# Patient Record
Sex: Male | Born: 2000 | Race: Black or African American | Hispanic: No | Marital: Single | Smoking: Current every day smoker
Health system: Southern US, Community
[De-identification: ages and names within clinical notes are randomized; demographics above are authoritative.]

---
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
## 2021-06-18 ENCOUNTER — Emergency Department (HOSPITAL_COMMUNITY)
Admission: EM | Admit: 2021-06-18 | Discharge: 2021-06-18 | Disposition: A | Discharge status: Home / Self Care | Payer: Self-pay | Attending: Emergency Medicine | Admitting: Emergency Medicine

## 2021-06-18 ENCOUNTER — Other Ambulatory Visit: Payer: Self-pay

## 2021-06-18 ENCOUNTER — Encounter (HOSPITAL_COMMUNITY): Payer: Self-pay

## 2021-06-18 DIAGNOSIS — Z202 Contact with and (suspected) exposure to infections with a predominantly sexual mode of transmission: Secondary | ICD-10-CM | POA: Insufficient documentation

## 2021-06-18 LAB — URINALYSIS, ROUTINE W REFLEX MICROSCOPIC
Bilirubin Urine: NEGATIVE
Glucose, UA: NEGATIVE mg/dL
Hgb urine dipstick: NEGATIVE
Ketones, ur: 5 mg/dL — AB
Nitrite: NEGATIVE
Protein, ur: 30 mg/dL — AB
Specific Gravity, Urine: 1.03 (ref 1.005–1.030)
WBC, UA: >50 WBC/hpf — ABNORMAL HIGH (ref 0–5)
pH: 7 (ref 5.0–8.0)

## 2021-06-18 MED ORDER — LIDOCAINE HCL (PF) 1 % IJ SOLN
1.0000 mL | Freq: Once | INTRAMUSCULAR | Status: DC
  Filled 2021-06-18: qty 5

## 2021-06-18 MED ORDER — DOXYCYCLINE HYCLATE 100 MG PO TABS: 100.0000 mg | ORAL_TABLET | Freq: Two times a day (BID) | ORAL | 0 refills | Status: AC

## 2021-06-18 MED ORDER — CEFTRIAXONE SODIUM 500 MG IJ SOLR
500.0000 mg | Freq: Once | INTRAMUSCULAR | Status: AC
  Administered 2021-06-18: 500 mg via INTRAMUSCULAR
  Filled 2021-06-18: qty 500

## 2021-06-18 MED ORDER — METRONIDAZOLE 500 MG PO TABS
2000.0000 mg | ORAL_TABLET | Freq: Once | ORAL | Status: AC
  Administered 2021-06-18: 2000 mg via ORAL
  Filled 2021-06-18: qty 4

## 2021-06-18 NOTE — ED Triage Notes (Signed)
Pt to ED from home with reports of exposure to STD, reports abd pain and penile discharge that started 2 days ago.  ?

## 2021-06-18 NOTE — ED Provider Notes (Signed)
?[facility] EMERGENCY DEPARTMENT ?Provider Note ? ? ?CSN: [id number] ?Arrival date & time: 06/18/21  1908 ? ?  ? ?History ? ?Chief Complaint  ?Patient presents with  ? Exposure to STD  ? ? ?Barry Cox is a 21 y.o. male presenting with a possible STI exposure 4 days ago.  Patient woke up the next morning with urethral discharge, subjective fever, suprapubic tenderness.  Highest recorded fever at home was 97.5 degrees.  Discharge described as creamy/white-colored.  Denies scrotal tenderness, painful bowel movements or abnormal bowel movements, decreased urination, difficulty urinating, hematuria, headache, nausea or vomiting.  Denies consuming alcohol in the last 72 hours.  Denies recent illness, shortness of breath, chest pain, dizziness, lightheadedness. ? ?The history is provided by the patient and medical records.  ?Exposure to STD ? ?  ? ?Home Medications ?Prior to Admission medications   ?Medication Sig Start Date End Date Taking? Authorizing Provider  ?doxycycline (VIBRA-TABS) 100 MG tablet Take 1 tablet (100 mg total) by mouth 2 (two) times daily for 7 days. 06/18/21 06/25/21 Yes Cecil Cobbs, PA-C  ?diphenhydrAMINE (BENADRYL) 25 mg capsule Take 25 mg by mouth once as needed. For allergies     [provider]  ?diphenhydrAMINE (SOMINEX) 25 MG tablet Take 25 mg by mouth at bedtime as needed.      [provider]  ?hydrocortisone 1 % cream Apply 1 application topically 2 (two) times daily. As needed for itching     [provider]  ?   ? ?Allergies    ?Patient has no known allergies.   ? ?Review of Systems   ?Review of Systems  ?Constitutional:  Positive for fever (Subjective).  ?Genitourinary:  Positive for dysuria and penile discharge.  ? ?Physical Exam ?Updated Vital Signs ?BP 130/62 (BP Location: Right Arm)   Pulse 76   Temp 97.9 ?F (36.6 ?C) (Oral)   Resp 18   Ht 6' (1.829 m)   Wt 83.9 kg   SpO2 99%   BMI 25.09 kg/m?  ?Physical Exam ?Vitals and nursing note reviewed.  Exam conducted with a chaperone present.  ?Constitutional:   ?   General: He is not in acute distress. ?   Appearance: He is well-developed.  ?HENT:  ?   Head: Normocephalic and atraumatic.  ?Eyes:  ?   Conjunctiva/sclera: Conjunctivae normal.  ?Cardiovascular:  ?   Rate and Rhythm: Normal rate and regular rhythm.  ?Pulmonary:  ?   Effort: Pulmonary effort is normal. No respiratory distress.  ?Abdominal:  ?   General: Bowel sounds are normal. There is no distension.  ?   Palpations: Abdomen is soft.  ?   Tenderness: There is abdominal tenderness (Mild/Discomfort) in the suprapubic area. There is no guarding or rebound.  ?Genitourinary: ?   Penis: Discharge present. No erythema, tenderness, swelling or lesions.   ?   Testes: Normal.     ?   Right: Tenderness or swelling not present.     ?   Left: Tenderness or swelling not present.  ?   Epididymis:  ?   Right: Normal.  ?   Left: Normal.  ?Musculoskeletal:     ?   General: No swelling.  ?   Cervical back: Neck supple.  ?Skin: ?   General: Skin is warm and dry.  ?   Capillary Refill: Capillary refill takes less than 2 seconds.  ?Neurological:  ?   Mental Status: He is alert and oriented to person, place, and time.  ?Psychiatric:     ?  Mood and Affect: Mood normal.  ? ? ?ED Results / Procedures / Treatments   ?Labs ?(all labs ordered are listed, but only abnormal results are displayed) ?Labs Reviewed  ?URINALYSIS, ROUTINE W REFLEX MICROSCOPIC - Abnormal; Notable for the following components:  ?    Result Value  ? APPearance HAZY (*)   ? Ketones, ur 5 (*)   ? Protein, ur 30 (*)   ? Leukocytes,Ua MODERATE (*)   ? WBC, UA >50 (*)   ? Bacteria, UA RARE (*)   ? All other components within normal limits  ?RPR  ?HIV ANTIBODY (ROUTINE TESTING W REFLEX)  ?GC/CHLAMYDIA PROBE AMP ([facility]) NOT AT [facility]  ? ? ?EKG ?None ? ?Radiology ?No results found. ? ?Procedures ?Procedures  ? ? ?Medications Ordered in ED ?Medications  ?lidocaine (PF) (XYLOCAINE) 1 % injection 1 mL (has no  administration in time range)  ?cefTRIAXone (ROCEPHIN) injection 500 mg (500 mg Intramuscular Given 06/18/21 2158)  ?metroNIDAZOLE (FLAGYL) tablet 2,000 mg (2,000 mg Oral Given 06/18/21 2158)  ? ? ?ED Course/ Medical Decision Making/ A&P ?  ?                        ?Medical Decision Making ?Amount and/or Complexity of Data Reviewed ?External Data Reviewed: notes. ?Labs: ordered. Decision-making details documented in ED Course. ? ?Risk ?OTC drugs. ?Prescription drug management. ? ? ?Patient presenting for STD exposure with urethral discharge, dysuria, and suprapubic tenderness.  Denies painful or difficult bowel movements, or fever to indicate prostatitis.  No tenderness to palpation or edema or erythema of the testes or epididymis to suggest orchitis or epididymitis.  STD cultures obtained including HIV, syphilis, trichomonas, gonorrhea and chlamydia -- results pending. ? ?Patient to be discharged with instructions to follow up with PCP. Discussed importance of using protection when sexually active. Pt understands that they have GC/Chlamydia cultures pending and that they will need to inform all sexual partners if results return positive.   Denies alcohol consumption in the last 72 hours.  Patient has been treated prophylactically with Flagyl and Rocephin.   Patient will take doxycycline in outpatient setting. ? ?I discussed the patient and their case with my attending, Dr. Effie Shy, who agreed with the proposed treatment course.  After consideration of the diagnostic results and the patients response to treatment, I feel that the patent would benefit from outpatient antibiotic treatment and follow up with primary care.  Discussed course of treatment thoroughly with the patient and he demonstrated understanding.  Patient in agreement and has no further questions. ? ?This chart was dictated using voice recognition software.  Despite best efforts to proofread,  errors can occur which can change the documentation  meaning. ? ? ? ? ? ? ? ? ?Final Clinical Impression(s) / ED Diagnoses ?Final diagnoses:  ?STD exposure  ? ? ?Rx / DC Orders ?ED Discharge Orders   ? ?      Ordered  ?  doxycycline (VIBRA-TABS) 100 MG tablet  2 times daily       ? 06/18/21 2136  ? ?  ?  ? ?  ? ? ?  ?Cecil Cobbs, PA-C ?06/18/21 2343 ? ?  ?Mancel Bale, MD ?06/20/21 1746 ? ?

## 2021-06-18 NOTE — Discharge Instructions (Addendum)
Follow-up with your primary care in the next 3 to 5 days for reevaluation and continued medical management ? ?A prescription for an antibiotic by the name of doxycycline has been sent to your pharmacy.  Please take this as prescribed until course is completed.  Please take with food. ? ?You must refrain from consuming alcohol for the next 2 to 3 days as discussed, as one of the medications provided for you today puts you at an increased risk for severe vomiting and nausea if mixed with alcohol. ? ?Return to the ED for new or worsening symptoms as discussed ?

## 2021-06-18 NOTE — ED Notes (Signed)
Pt NAD, a/ox4, states began having penile discharge, dysuria and lower abd pain 4 days ago s/p sexual encounter ?

## 2021-06-18 NOTE — ED Notes (Signed)
Pt NAD, a/ox4. Pt verbalizes understanding of all DC and f/u instructions. All questions answered. Pt walks with steady gait to lobby at DC.  ? ?

## 2021-06-19 LAB — GC/CHLAMYDIA PROBE AMP (~~LOC~~) NOT AT ARMC
Chlamydia: NEGATIVE
Comment: NEGATIVE
Comment: NORMAL
Neisseria Gonorrhea: POSITIVE — AB

## 2021-06-19 LAB — RPR: RPR Ser Ql: NONREACTIVE

## 2021-06-19 LAB — HIV ANTIBODY (ROUTINE TESTING W REFLEX): HIV Screen 4th Generation wRfx: NONREACTIVE

## 2021-08-28 ENCOUNTER — Encounter (HOSPITAL_COMMUNITY): Payer: Self-pay

## 2021-08-28 ENCOUNTER — Emergency Department (HOSPITAL_COMMUNITY): Payer: Worker's Compensation

## 2021-08-28 ENCOUNTER — Other Ambulatory Visit: Payer: Self-pay

## 2021-08-28 ENCOUNTER — Emergency Department (HOSPITAL_COMMUNITY)
Admission: EM | Admit: 2021-08-28 | Discharge: 2021-08-28 | Disposition: A | Discharge status: Home / Self Care | Payer: Worker's Compensation | Attending: Emergency Medicine | Admitting: Emergency Medicine

## 2021-08-28 DIAGNOSIS — S61315A Laceration without foreign body of left ring finger with damage to nail, initial encounter: Secondary | ICD-10-CM | POA: Diagnosis not present

## 2021-08-28 DIAGNOSIS — W230XXA Caught, crushed, jammed, or pinched between moving objects, initial encounter: Secondary | ICD-10-CM | POA: Insufficient documentation

## 2021-08-28 DIAGNOSIS — S6710XA Crushing injury of unspecified finger(s), initial encounter: Secondary | ICD-10-CM

## 2021-08-28 DIAGNOSIS — Y990 Civilian activity done for income or pay: Secondary | ICD-10-CM | POA: Diagnosis not present

## 2021-08-28 DIAGNOSIS — S62633B Displaced fracture of distal phalanx of left middle finger, initial encounter for open fracture: Secondary | ICD-10-CM | POA: Diagnosis not present

## 2021-08-28 DIAGNOSIS — S61319A Laceration without foreign body of unspecified finger with damage to nail, initial encounter: Secondary | ICD-10-CM

## 2021-08-28 DIAGNOSIS — Z23 Encounter for immunization: Secondary | ICD-10-CM | POA: Insufficient documentation

## 2021-08-28 DIAGNOSIS — S62639B Displaced fracture of distal phalanx of unspecified finger, initial encounter for open fracture: Secondary | ICD-10-CM

## 2021-08-28 DIAGNOSIS — S6992XA Unspecified injury of left wrist, hand and finger(s), initial encounter: Secondary | ICD-10-CM | POA: Diagnosis present

## 2021-08-28 MED ORDER — LIDOCAINE HCL (PF) 1 % IJ SOLN
30.0000 mL | Freq: Once | INTRAMUSCULAR | Status: AC
  Administered 2021-08-28: 30 mL
  Filled 2021-08-28: qty 30

## 2021-08-28 MED ORDER — CEPHALEXIN 500 MG PO CAPS: 500.0000 mg | ORAL_CAPSULE | Freq: Two times a day (BID) | ORAL | 0 refills | Status: AC

## 2021-08-28 MED ORDER — OXYCODONE-ACETAMINOPHEN 5-325 MG PO TABS
1.0000 | ORAL_TABLET | Freq: Once | ORAL | Status: AC
  Administered 2021-08-28: 1 via ORAL
  Filled 2021-08-28: qty 1

## 2021-08-28 MED ORDER — OXYCODONE-ACETAMINOPHEN 5-325 MG PO TABS: 1.0000 | ORAL_TABLET | Freq: Four times a day (QID) | ORAL | 0 refills | Status: AC | PRN

## 2021-08-28 MED ORDER — TETANUS-DIPHTH-ACELL PERTUSSIS 5-2.5-18.5 LF-MCG/0.5 IM SUSY
0.5000 mL | PREFILLED_SYRINGE | Freq: Once | INTRAMUSCULAR | Status: AC
  Administered 2021-08-28: 0.5 mL via INTRAMUSCULAR
  Filled 2021-08-28: qty 0.5

## 2021-08-28 NOTE — Discharge Instructions (Signed)
As we discussed, the tip of your middle and ring finger of your left hand are broken. You also have lacerations of the nailbed of both of these fingers which have been repaired in the emergency department this evening. You will need to follow-up with hand surgery for further evaluation and management of this. I have given you a referral with a number to call to schedule an appointment with our hand surgeon Dr. Caralyn Guile. In the interim, please keep these wounds covered with the dressings I have provided for you and splinted. You will also need to fill and complete the course of antibiotics that I have prescribed for you. I have also given you a prescription for narcotics to that as prescribed as needed for severe pain only. Do not drive or operate heavy machinery while taking this medication.   You may wash your hands with soap and water and pat dry gently. I also recommend that you do regular dressing changes.   Return if you develop signs of infection including fevers, chills, redness, or yellow discharge. You will also need to return if the metal pieces in your nailbed become displaced as this could cause your nail to not heal correctly.  Return if development of any new or worsening symptoms.

## 2021-08-28 NOTE — ED Triage Notes (Signed)
Pt arrived POV from [facility] c/o a left hand/finger fx. Pt states he jammed his fingers at work.

## 2021-08-28 NOTE — ED Provider Notes (Signed)
[facility] EMERGENCY DEPARTMENT Provider Note   CSN: [id number] Arrival date & time: 08/28/21  1310     History {Add pertinent medical, surgical, social history, OB history to HPI:1} Chief Complaint  Patient presents with   Finger Injury    Barry Cox is a 21 y.o. male.  Patient with no pertinent past medical history presents today with finger injury. He states that earlier today at work he had the tip of his left third and fourth digits crushed between 2 steel doors.  He states that he went to urgent care for same and was sent here for further evaluation and management of his injuries.  The history is provided by the patient. No language interpreter was used.       Home Medications Prior to Admission medications   Medication Sig Start Date End Date Taking? Authorizing Provider  diphenhydrAMINE (BENADRYL) 25 mg capsule Take 25 mg by mouth once as needed. For allergies     [provider]  diphenhydrAMINE (SOMINEX) 25 MG tablet Take 25 mg by mouth at bedtime as needed.      [provider]  hydrocortisone 1 % cream Apply 1 application topically 2 (two) times daily. As needed for itching     [provider]      Allergies    Patient has no known allergies.    Review of Systems   Review of Systems  Musculoskeletal:  Positive for arthralgias.  All other systems reviewed and are negative.  Physical Exam Updated Vital Signs BP (!) 142/79 (BP Location: Right Arm)   Pulse 82   Temp 98.4 F (36.9 C) (Oral)   Resp 20   Ht 5\' 11"  (1.803 m)   Wt 81.6 kg   SpO2 98%   BMI 25.10 kg/m  Physical Exam Vitals and nursing note reviewed.  Constitutional:      General: He is not in acute distress.    Appearance: Normal appearance. He is normal weight. He is not ill-appearing, toxic-appearing or diaphoretic.  HENT:     Head: Normocephalic and atraumatic.  Cardiovascular:     Rate and Rhythm: Normal rate.  Pulmonary:     Effort:  Pulmonary effort is normal. No respiratory distress.  Musculoskeletal:        General: Normal range of motion.     Cervical back: Normal range of motion.     Comments: Left third and fourth fingers with bilateral nailbed lacerations and subungal hematomas. See images below for further  Skin:    General: Skin is warm and dry.  Neurological:     General: No focal deficit present.     Mental Status: He is alert.  Psychiatric:        Mood and Affect: Mood normal.        Behavior: Behavior normal.    ED Results / Procedures / Treatments   Labs (all labs ordered are listed, but only abnormal results are displayed) Labs Reviewed - No data to display  EKG None  Radiology DG Hand Complete Left  Result Date: 08/28/2021 CLINICAL DATA:  Hand injury. EXAM: LEFT HAND - COMPLETE 3+ VIEW COMPARISON:  Same-day radiograph at 1041 hours FINDINGS: Soft tissue swelling about the third and fourth digits. Comminuted fractures of the tuft of the third and fourth digit. IMPRESSION: Comminuted tuft fractures of the third and fourth digit. Electronically Signed   By: 08/30/2021 M.D.   On: 08/28/2021 14:42    Procedures Procedures  {Document cardiac monitor,  telemetry assessment procedure when appropriate:1}  Medications Ordered in ED Medications  oxyCODONE-acetaminophen (PERCOCET/ROXICET) 5-325 MG per tablet 1 tablet (1 tablet Oral Given 08/28/21 1538)  lidocaine (PF) (XYLOCAINE) 1 % injection 30 mL (30 mLs Infiltration Given 08/28/21 1538)  Tdap (BOOSTRIX) injection 0.5 mL (0.5 mLs Intramuscular Given 08/28/21 1538)    ED Course/ Medical Decision Making/ A&P                           Medical Decision Making Amount and/or Complexity of Data Reviewed Radiology: ordered.  Risk Prescription drug management.     {Document critical care time when appropriate:1} {Document review of labs and clinical decision tools ie heart score, Chads2Vasc2 etc:1}  {Document your independent review of  radiology images, and any outside records:1} {Document your discussion with family members, caretakers, and with consultants:1} {Document social determinants of health affecting pt's care:1} {Document your decision making why or why not admission, treatments were needed:1} Final Clinical Impression(s) / ED Diagnoses Final diagnoses:  None    Rx / DC Orders ED Discharge Orders     None

## 2023-05-07 IMAGING — CR DG HAND COMPLETE 3+V*L*
3 series · 3 of 3 positions shown · non-contrast
Comparison: Same-day radiograph at 1581 hours

CLINICAL DATA: Hand injury.

EXAM:
LEFT HAND - COMPLETE 3+ VIEW

[hand pa]
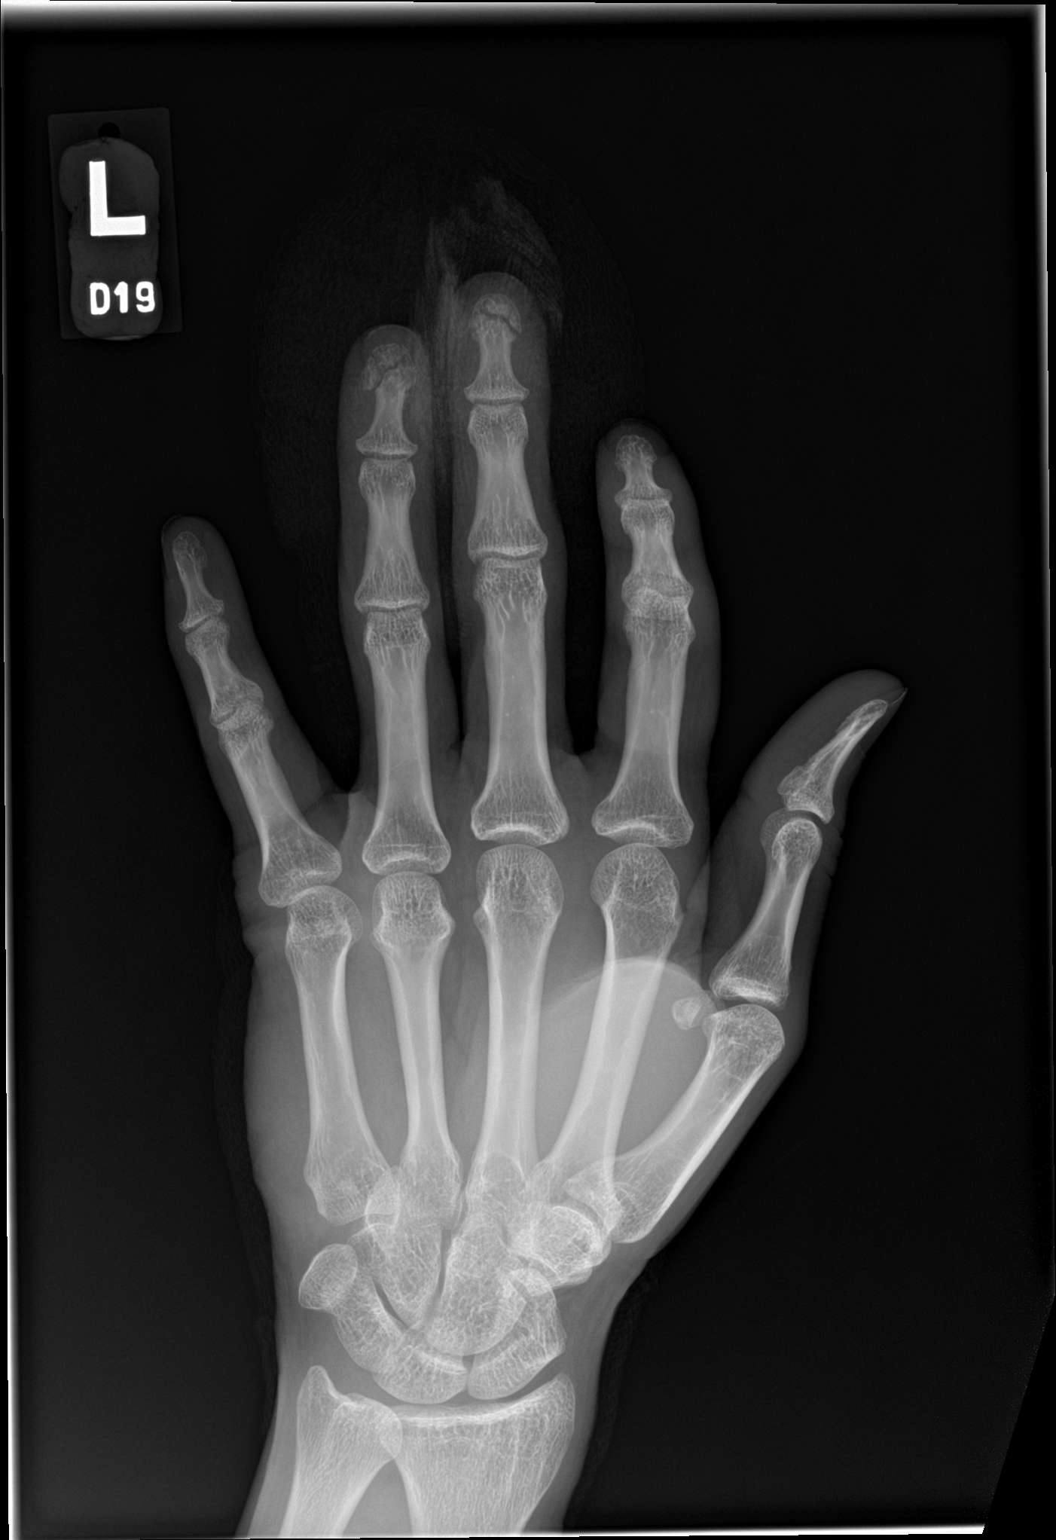

[hand obl]
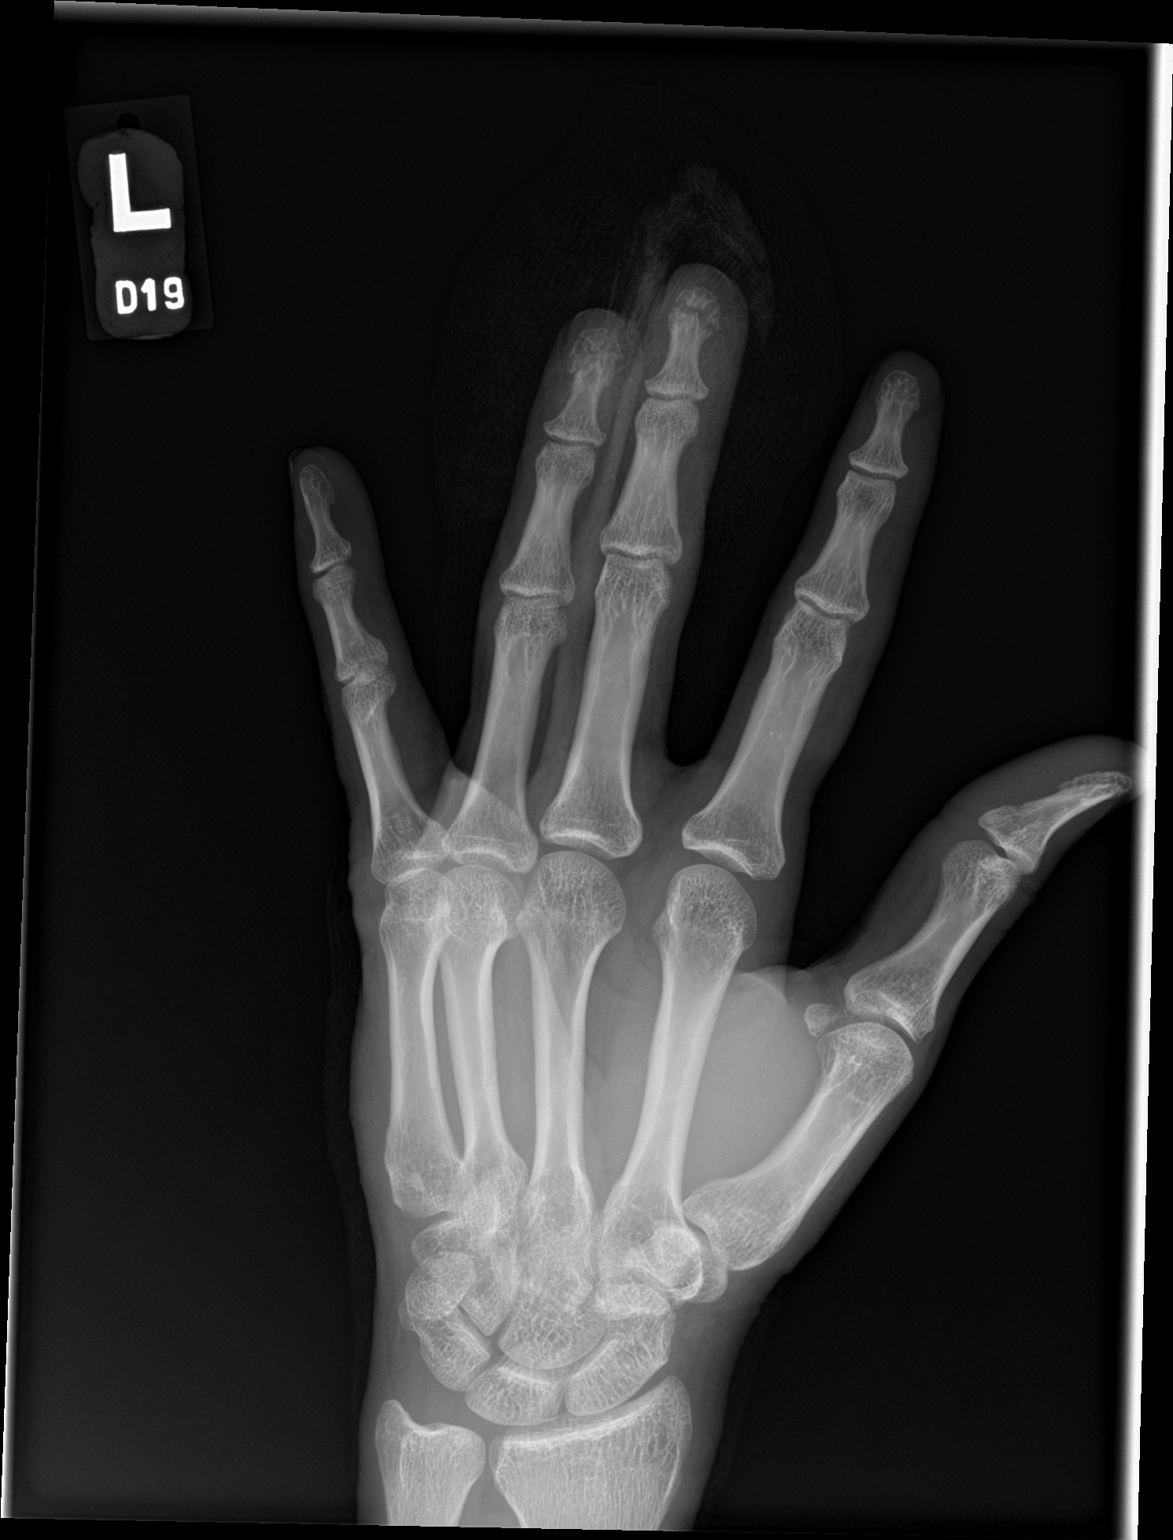

[hand lat]
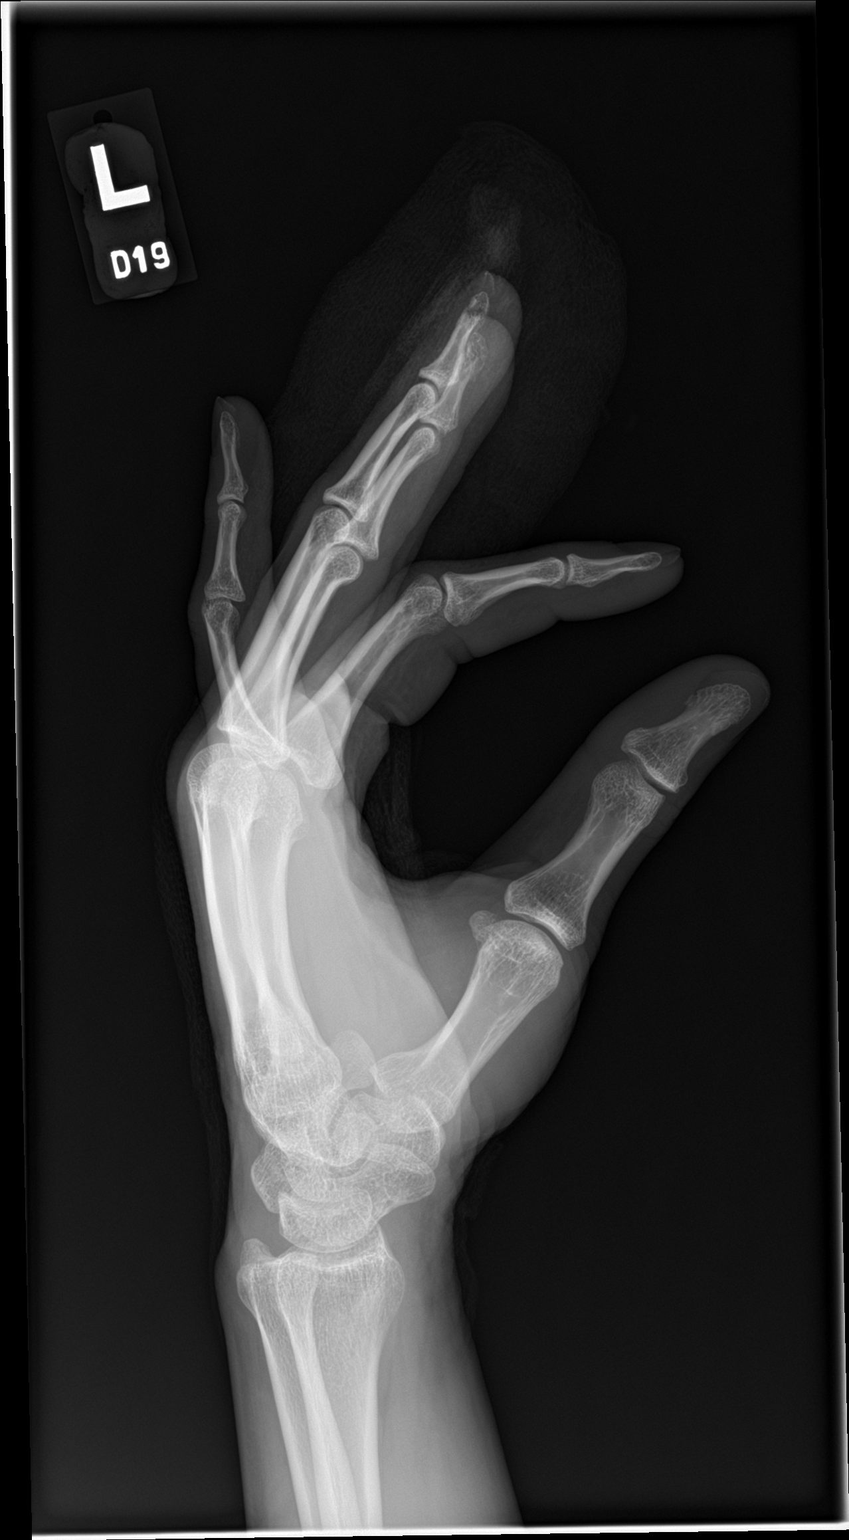

[3 of 3 positions shown; findings below may reference images not displayed]

FINDINGS: Soft tissue swelling about the third and fourth digits. Comminuted
fractures of the tuft of the third and fourth digit.
IMPRESSION: Comminuted tuft fractures of the third and fourth digit.
